# Patient Record
Sex: Female | Born: 1990
Health system: Southern US, Community
[De-identification: ages and names within clinical notes are randomized; demographics above are authoritative.]

## PROBLEM LIST (undated history)

## (undated) DIAGNOSIS — B001 Herpesviral vesicular dermatitis: Secondary | ICD-10-CM

## (undated) HISTORY — DX: Herpesviral vesicular dermatitis: B00.1

## (undated) HISTORY — PX: OTHER SURGICAL HISTORY: SHX169

---
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
## 2016-04-25 ENCOUNTER — Emergency Department (HOSPITAL_COMMUNITY)
Admission: EM | Admit: 2016-04-25 | Discharge: 2016-04-25 | Disposition: A | Discharge status: Home / Self Care | Payer: Self-pay | Attending: Emergency Medicine | Admitting: Emergency Medicine

## 2016-04-25 ENCOUNTER — Encounter (HOSPITAL_COMMUNITY): Payer: Self-pay | Admitting: Emergency Medicine

## 2016-04-25 DIAGNOSIS — R05 Cough: Secondary | ICD-10-CM | POA: Insufficient documentation

## 2016-04-25 DIAGNOSIS — J3489 Other specified disorders of nose and nasal sinuses: Secondary | ICD-10-CM | POA: Insufficient documentation

## 2016-04-25 DIAGNOSIS — B001 Herpesviral vesicular dermatitis: Secondary | ICD-10-CM | POA: Insufficient documentation

## 2016-04-25 MED ORDER — VALACYCLOVIR HCL 1 G PO TABS: ORAL_TABLET | ORAL | 0 refills | Status: AC

## 2016-04-25 MED ORDER — HYDROCODONE-ACETAMINOPHEN 5-325 MG PO TABS: 2.0000 | ORAL_TABLET | Freq: Once | ORAL | Status: DC

## 2016-04-25 NOTE — ED Triage Notes (Signed)
Pt reports waking up with cold sore to top mid lip.  No swelling noted, airway patent.

## 2016-04-25 NOTE — ED Provider Notes (Signed)
[facility]PT Provider Note   CSN: [id number] Arrival date & time: 04/25/16  1111  By signing my name below, I, Soijett Blue, attest that this documentation has been prepared under the direction and in the presence of Ivery QualeHobson Cherylin Waguespack, PA-C Electronically Signed: Soijett Blue, ED Scribe. 04/25/16. 11:52 AM.   History   Chief Complaint Chief Complaint  Patient presents with  . Mouth Lesions    HPI Melanie Noble is a 25 y.o. female who presents to the Emergency Department complaining of mouth lesion onset this morning. Pt notes that she believes she has a cold sore, but she wants to make sure that it is a cold sore and that is what brought her in to be evaluated. Pt reports that the area is pruritic and non-painful at this time. Pt is having associated symptoms of rhinorrhea and cough. She notes that she has tried OTC allergy medications for the relief of her symptoms. She denies fever, chills, rash, and any other symptoms.     The history is provided by the patient. No language interpreter was used.  Mouth Lesions  Location:  Upper lip Upper lip location: Mid. Quality:  Blistered Onset quality:  Sudden Severity:  Mild Duration:  1 hour Progression:  Unchanged Chronicity:  New Relieved by:  None tried Worsened by:  Nothing Ineffective treatments:  None tried Associated symptoms: rhinorrhea   Associated symptoms: no fever and no rash     History reviewed. No pertinent past medical history.  There are no active problems to display for this patient.   History reviewed. No pertinent surgical history.  OB History    No data available       Home Medications    Prior to Admission medications   Not on File    Family History History reviewed. No pertinent family history.  Social History Social History  Substance Use Topics  . Smoking status: Never Smoker  . Smokeless tobacco: Not on file  . Alcohol use No     Allergies   Review of patient's allergies  indicates no known allergies.   Review of Systems Review of Systems  Constitutional: Negative for chills and fever.  HENT: Positive for mouth sores (mid upper lip) and rhinorrhea.   Respiratory: Positive for cough.   Skin: Negative for rash.  All other systems reviewed and are negative.    Physical Exam Updated Vital Signs BP 117/68 (BP Location: Right Arm)   Pulse 75   Temp 98.3 F (36.8 C) (Oral)   Resp 18   Ht 5\' 5"  (1.651 m)   Wt 230 lb (104.3 kg)   LMP 03/29/2016 (Exact Date)   SpO2 99%   BMI 38.27 kg/m   Physical Exam  Constitutional: She is oriented to person, place, and time. She appears well-developed and well-nourished. No distress.  HENT:  Head: Normocephalic and atraumatic.  Mouth/Throat: Uvula is midline, oropharynx is clear and moist and mucous membranes are normal. Oral lesions present.  Small broken skins areas of mucosa of lower lip. No lesions of lower gum. Blister at the mid upper lip. No lesions of the mucosa of the upper lip. Oropharynx clear.  Eyes: EOM are normal.  Neck: Normal range of motion. Neck supple.  No cervical LAD. Neck is supple with FROM.  Cardiovascular: Normal rate, regular rhythm and normal heart sounds.  Exam reveals no gallop and no friction rub.   No murmur heard. Pulmonary/Chest: Effort normal and breath sounds normal. No respiratory distress. She has no wheezes.  She has no rales.  Abdominal: She exhibits no distension.  Musculoskeletal: Normal range of motion.  Lymphadenopathy:    She has no cervical adenopathy.  Neurological: She is alert and oriented to person, place, and time.  Skin: Skin is warm and dry. No rash noted.  No rash to palms.  Psychiatric: She has a normal mood and affect. Her behavior is normal.  Nursing note and vitals reviewed.    ED Treatments / Results  DIAGNOSTIC STUDIES: Oxygen Saturation is 99% on RA, nl by my interpretation.    COORDINATION OF CARE: 11:49 AM Discussed treatment plan with pt at  bedside which includes Rx valtrex and pt agreed to plan.   Procedures Procedures (including critical care time)  Medications Ordered in ED Medications - No data to display   Initial Impression / Assessment and Plan / ED Course  I have reviewed the triage vital signs and the nursing notes.  Clinical Course    **I have reviewed nursing notes, vital signs, and all appropriate lab and imaging results for this patient.*  Final Clinical Impressions(s) / ED Diagnoses  The vital signs within normal limits. The examination favors herpes labialis (cold sore). I discussed with the patient the use of a brief or Campho-Phenique. The patient states that she would rather have Valtrex. Prescription for Valtrex given to the patient. We also discussed the importance of handwashing, and on not sharing eating utensils or kissing until symptoms had completely resolved.    Final diagnoses:  None    New Prescriptions New Prescriptions   No medications on file   **I personally performed the services described in this documentation, which was scribed in my presence. The recorded information has been reviewed and is accurate.Ivery Quale, PA-C 04/25/16 1203    Nelva Nay, MD 04/30/16 [phone]

## 2016-04-25 NOTE — Discharge Instructions (Signed)
You have a cold sore present. Please do not share eating utensils, or kiss anyone until this has completely resolved. Use 2 Valtrex 2 times daily, 12 hours apart. Wash hands frequently.

## 2019-06-25 ENCOUNTER — Ambulatory Visit: Payer: Medicaid Other | Attending: Internal Medicine

## 2019-06-25 ENCOUNTER — Other Ambulatory Visit: Payer: Self-pay

## 2019-06-25 DIAGNOSIS — Z20822 Contact with and (suspected) exposure to covid-19: Secondary | ICD-10-CM

## 2019-06-26 LAB — NOVEL CORONAVIRUS, NAA: SARS-CoV-2, NAA: NOT DETECTED

## 2019-07-25 DIAGNOSIS — Z20828 Contact with and (suspected) exposure to other viral communicable diseases: Secondary | ICD-10-CM | POA: Diagnosis not present

## 2019-08-16 DIAGNOSIS — Z20828 Contact with and (suspected) exposure to other viral communicable diseases: Secondary | ICD-10-CM | POA: Diagnosis not present

## 2019-09-23 DIAGNOSIS — R197 Diarrhea, unspecified: Secondary | ICD-10-CM | POA: Diagnosis not present

## 2019-09-23 DIAGNOSIS — Z6839 Body mass index (BMI) 39.0-39.9, adult: Secondary | ICD-10-CM | POA: Diagnosis not present

## 2019-09-23 DIAGNOSIS — R109 Unspecified abdominal pain: Secondary | ICD-10-CM | POA: Diagnosis not present

## 2019-10-10 DIAGNOSIS — Z8619 Personal history of other infectious and parasitic diseases: Secondary | ICD-10-CM | POA: Diagnosis not present

## 2019-10-10 DIAGNOSIS — R197 Diarrhea, unspecified: Secondary | ICD-10-CM | POA: Diagnosis not present

## 2019-10-10 DIAGNOSIS — Z6839 Body mass index (BMI) 39.0-39.9, adult: Secondary | ICD-10-CM | POA: Diagnosis not present

## 2019-10-10 DIAGNOSIS — R109 Unspecified abdominal pain: Secondary | ICD-10-CM | POA: Diagnosis not present

## 2020-02-14 DIAGNOSIS — Z20822 Contact with and (suspected) exposure to covid-19: Secondary | ICD-10-CM | POA: Diagnosis not present

## 2020-04-01 DIAGNOSIS — Z975 Presence of (intrauterine) contraceptive device: Secondary | ICD-10-CM | POA: Diagnosis not present

## 2020-04-01 DIAGNOSIS — Z01419 Encounter for gynecological examination (general) (routine) without abnormal findings: Secondary | ICD-10-CM | POA: Diagnosis not present

## 2020-04-01 DIAGNOSIS — Z6839 Body mass index (BMI) 39.0-39.9, adult: Secondary | ICD-10-CM | POA: Diagnosis not present

## 2020-04-01 DIAGNOSIS — N941 Unspecified dyspareunia: Secondary | ICD-10-CM | POA: Diagnosis not present

## 2020-04-13 DIAGNOSIS — N941 Unspecified dyspareunia: Secondary | ICD-10-CM | POA: Diagnosis not present

## 2020-04-13 DIAGNOSIS — Z975 Presence of (intrauterine) contraceptive device: Secondary | ICD-10-CM | POA: Diagnosis not present

## 2020-04-13 DIAGNOSIS — N83291 Other ovarian cyst, right side: Secondary | ICD-10-CM | POA: Diagnosis not present

## 2020-04-13 DIAGNOSIS — R9389 Abnormal findings on diagnostic imaging of other specified body structures: Secondary | ICD-10-CM | POA: Diagnosis not present

## 2020-04-13 DIAGNOSIS — N83201 Unspecified ovarian cyst, right side: Secondary | ICD-10-CM | POA: Diagnosis not present

## 2020-04-17 DIAGNOSIS — Z975 Presence of (intrauterine) contraceptive device: Secondary | ICD-10-CM | POA: Diagnosis not present

## 2020-04-17 DIAGNOSIS — R197 Diarrhea, unspecified: Secondary | ICD-10-CM | POA: Diagnosis not present

## 2020-04-17 DIAGNOSIS — N941 Unspecified dyspareunia: Secondary | ICD-10-CM | POA: Diagnosis not present

## 2020-04-17 DIAGNOSIS — M6289 Other specified disorders of muscle: Secondary | ICD-10-CM | POA: Diagnosis not present

## 2020-04-28 DIAGNOSIS — N941 Unspecified dyspareunia: Secondary | ICD-10-CM | POA: Diagnosis not present

## 2020-05-07 ENCOUNTER — Encounter: Payer: Self-pay | Admitting: Internal Medicine

## 2020-06-02 DIAGNOSIS — H6690 Otitis media, unspecified, unspecified ear: Secondary | ICD-10-CM | POA: Diagnosis not present

## 2020-06-08 ENCOUNTER — Other Ambulatory Visit: Payer: Self-pay

## 2020-06-08 ENCOUNTER — Ambulatory Visit (INDEPENDENT_AMBULATORY_CARE_PROVIDER_SITE_OTHER): Payer: BC Managed Care – PPO | Admitting: Internal Medicine

## 2020-06-08 ENCOUNTER — Encounter: Payer: Self-pay | Admitting: Internal Medicine

## 2020-06-08 DIAGNOSIS — R197 Diarrhea, unspecified: Secondary | ICD-10-CM | POA: Insufficient documentation

## 2020-06-08 DIAGNOSIS — R12 Heartburn: Secondary | ICD-10-CM

## 2020-06-08 DIAGNOSIS — K219 Gastro-esophageal reflux disease without esophagitis: Secondary | ICD-10-CM | POA: Diagnosis not present

## 2020-06-08 DIAGNOSIS — R109 Unspecified abdominal pain: Secondary | ICD-10-CM | POA: Diagnosis not present

## 2020-06-08 MED ORDER — OMEPRAZOLE 20 MG PO CPDR: 20.0000 mg | DELAYED_RELEASE_CAPSULE | Freq: Two times a day (BID) | ORAL | 5 refills | Status: DC

## 2020-06-08 NOTE — Progress Notes (Signed)
Primary Care Physician:  Lawerance Sabal, [place] Primary Gastroenterologist:  Dr. Marletta Lor  Chief Complaint  Patient presents with  . Diarrhea    2-3 times per day  . Abdominal Pain    comes/goes    HPI:   Melanie Noble is a 29 y.o. female who presents to the clinic today by referral from her Quentin Mulling for evaluation.  Patient has multiple GI complaints for me today.  She states she has daily heartburn and acid reflux.  Notes a sour taste in her mouth.  Also regurgitates food on occasion.  No overt dysphagia odynophagia.  No melena hematochezia.  No chronic NSAID use.  No history of H. pylori or peptic ulcer disease.  Also notes diarrhea daily.  States sometimes it is watery and other times just very loose.  Has noted that it's been dark as of late but she has been drinking Pepto-Bismol.  No unintentional weight loss.  No family history of inflammatory bowel disease.  Does note her grandfather died of colon cancer around the age of 51.  Also notes that both her mother and sister have thyroid disease.  Otherwise she has no other complaints.  Past Medical History:  Diagnosis Date  . Cold sore     Past Surgical History:  Procedure Laterality Date  . none      Current Outpatient Medications  Medication Sig Dispense Refill  . valACYclovir (VALTREX) 1000 MG tablet 2 tabs bid, take with food (Patient taking differently: daily. ) 4 tablet 0   No current facility-administered medications for this visit.    Allergies as of 06/08/2020  . (No Known Allergies)    Family History  Problem Relation Age of Onset  . Thyroid disease Mother   . Heart disease Father   . Colon cancer Maternal Grandfather     Social History   Socioeconomic History  . Marital status: Single    Spouse name: Not on file  . Number of children: Not on file  . Years of education: Not on file  . Highest education level: Not on file  Occupational History  . Not on file  Tobacco Use  . Smoking status:  Never Smoker  . Smokeless tobacco: Never Used  Substance and Sexual Activity  . Alcohol use: Yes    Comment: occas  . Drug use: No  . Sexual activity: Not on file  Other Topics Concern  . Not on file  Social History Narrative  . Not on file   Social Determinants of Health   Financial Resource Strain:   . Difficulty of Paying Living Expenses: Not on file  Food Insecurity:   . Worried About Programme researcher, broadcasting/film/video in the Last Year: Not on file  . Ran Out of Food in the Last Year: Not on file  Transportation Needs:   . Lack of Transportation (Medical): Not on file  . Lack of Transportation (Non-Medical): Not on file  Physical Activity:   . Days of Exercise per Week: Not on file  . Minutes of Exercise per Session: Not on file  Stress:   . Feeling of Stress : Not on file  Social Connections:   . Frequency of Communication with Friends and Family: Not on file  . Frequency of Social Gatherings with Friends and Family: Not on file  . Attends Religious Services: Not on file  . Active Member of Clubs or Organizations: Not on file  . Attends Banker Meetings: Not on file  .  Marital Status: Not on file  Intimate Partner Violence:   . Fear of Current or Ex-Partner: Not on file  . Emotionally Abused: Not on file  . Physically Abused: Not on file  . Sexually Abused: Not on file    Subjective: Review of Systems  Constitutional: Negative for chills and fever.  HENT: Negative for congestion and hearing loss.   Eyes: Negative for blurred vision and double vision.  Respiratory: Negative for cough and shortness of breath.   Cardiovascular: Negative for chest pain and palpitations.  Gastrointestinal: Positive for diarrhea and heartburn. Negative for abdominal pain, blood in stool, constipation, melena and vomiting.  Genitourinary: Negative for dysuria and urgency.  Musculoskeletal: Negative for joint pain and myalgias.  Skin: Negative for itching and rash.  Neurological:  Negative for dizziness and headaches.  Psychiatric/Behavioral: Negative for depression. The patient is not nervous/anxious.        Objective: BP 124/84   Pulse 64   Temp 97.7 F (36.5 C)   Ht 5\' 3"  (1.6 m)   Wt 233 lb 12.8 oz (106.1 kg)   BMI 41.42 kg/m  Physical Exam Constitutional:      Appearance: Normal appearance. She is obese.  HENT:     Head: Normocephalic and atraumatic.  Eyes:     Extraocular Movements: Extraocular movements intact.     Conjunctiva/sclera: Conjunctivae normal.  Cardiovascular:     Rate and Rhythm: Normal rate and regular rhythm.  Pulmonary:     Effort: Pulmonary effort is normal.     Breath sounds: Normal breath sounds.  Abdominal:     General: Bowel sounds are normal.     Palpations: Abdomen is soft.  Musculoskeletal:        General: No swelling. Normal range of motion.     Cervical back: Normal range of motion and neck supple.  Skin:    General: Skin is warm and dry.     Coloration: Skin is not jaundiced.  Neurological:     General: No focal deficit present.     Mental Status: She is alert and oriented to person, place, and time.  Psychiatric:        Mood and Affect: Mood normal.        Behavior: Behavior normal.      Assessment: *Heartburn *Acid reflux *Abdominal cramping *Diarrhea-chronic  Plan: Etiology of patient's symptoms unclear.  Irritable bowel syndrome is certainly high on differential though we'll have to rule out other etiologies.    Will schedule for EGD to evaluate for peptic ulcer disease, esophagitis, gastritis, H. Pylori, duodenitis, or other. Will also evaluate for esophageal stricture, Schatzki's ring, esophageal web or other.   At the same time we will perform colonoscopy with random colon biopsies to rule out underlying inflammatory bowel disease such as Crohn's disease or ulcerative colitis, microscopic colitis, polyps, occult malignancy, or other.  The risks including infection, bleed, or perforation as  well as benefits, limitations, alternatives and imponderables have been reviewed with the patient. Potential for esophageal dilation, biopsy, etc. have also been reviewed.  Questions have been answered. All parties agreeable.  If EGD and colonoscopy unremarkable, we will consider right upper quadrant ultrasound to rule out biliary colic.  Thank you for the kind referral.  06/08/2020 3:18 PM   Disclaimer: This note was dictated with voice recognition software. Similar sounding words can inadvertently be transcribed and may not be corrected upon review.

## 2020-06-08 NOTE — Patient Instructions (Signed)
We will schedule you for EGD to further evaluate your worsening heartburn and acid reflux.  At the same time we'll perform colonoscopy to evaluate your diarrhea and abdominal cramping.  Going to start you on a new medication called omeprazole 20 mg twice daily.  I want you to take this 30 minutes before breakfast and 30 minutes before dinner.  If endoscopy/colonoscopy unremarkable, we'll consider right upper quadrant ultrasound to evaluate your gallbladder.  Further recommendations to follow  At [facility] we value your feedback. You may receive a survey about your visit today. Please share your experience as we strive to create trusting relationships with our patients to provide genuine, compassionate, quality care.  We appreciate your understanding and patience as we review any laboratory studies, imaging, and other diagnostic tests that are ordered as we care for you. Our office policy is 5 business days for review of these results, and any emergent or urgent results are addressed in a timely manner for your best interest. If you do not hear from our office in 1 week, please contact us.   We also encourage the use of MyChart, which contains your medical information for your review as well. If you are not enrolled in this feature, an access code is on this after visit summary for your convenience. Thank you for allowing [place] to be involved in your care.  It was great to see you today!  I hope you have a great rest of your winter!!    Hennie Duos. Marletta Lor, D.O. Gastroenterology and Hepatology [facility]

## 2020-06-10 LAB — TISSUE TRANSGLUTAMINASE, IGA

## 2020-06-10 LAB — COMPREHENSIVE METABOLIC PANEL

## 2020-06-10 LAB — TSH+FREE T4

## 2020-06-10 LAB — IGA

## 2020-06-11 ENCOUNTER — Ambulatory Visit: Payer: Self-pay | Admitting: Internal Medicine

## 2020-06-15 ENCOUNTER — Other Ambulatory Visit: Payer: Self-pay | Admitting: Internal Medicine

## 2020-06-15 DIAGNOSIS — R197 Diarrhea, unspecified: Secondary | ICD-10-CM | POA: Diagnosis not present

## 2020-06-15 DIAGNOSIS — Z79899 Other long term (current) drug therapy: Secondary | ICD-10-CM | POA: Diagnosis not present

## 2020-06-15 DIAGNOSIS — R109 Unspecified abdominal pain: Secondary | ICD-10-CM | POA: Diagnosis not present

## 2020-06-16 LAB — COMPREHENSIVE METABOLIC PANEL
AG Ratio: 1.4 (calc) (ref 1.0–2.5)
ALT: 14 U/L (ref 6–29)
AST: 14 U/L (ref 10–30)
Albumin: 4 g/dL (ref 3.6–5.1)
Alkaline phosphatase (APISO): 78 U/L (ref 31–125)
BUN: 11 mg/dL (ref 7–25)
CO2: 27 mmol/L (ref 20–32)
Calcium: 9.4 mg/dL (ref 8.6–10.2)
Chloride: 105 mmol/L (ref 98–110)
Creat: 0.87 mg/dL (ref 0.50–1.10)
Globulin: 2.9 g/dL (calc) (ref 1.9–3.7)
Glucose, Bld: 98 mg/dL (ref 65–139)
Potassium: 4.3 mmol/L (ref 3.5–5.3)
Sodium: 139 mmol/L (ref 135–146)
Total Bilirubin: 0.4 mg/dL (ref 0.2–1.2)
Total Protein: 6.9 g/dL (ref 6.1–8.1)

## 2020-06-16 LAB — IGA: Immunoglobulin A: 125 mg/dL (ref 47–310)

## 2020-06-16 LAB — TISSUE TRANSGLUTAMINASE, IGA: (tTG) Ab, IgA: <1 U/mL

## 2020-06-16 LAB — TSH+FREE T4: TSH W/REFLEX TO FT4: 1.39 mIU/L

## 2020-06-19 NOTE — Progress Notes (Signed)
Called pt and informed her that her blood tests were negative for celiac disease, thyroid studies normal. Informed pt that we should proceed with EGD/CLN which is already be scheduled.  Pt voiced understanding.

## 2020-07-23 ENCOUNTER — Other Ambulatory Visit (HOSPITAL_COMMUNITY): Payer: BC Managed Care – PPO | Attending: Internal Medicine

## 2020-07-23 ENCOUNTER — Encounter (HOSPITAL_COMMUNITY)
Admission: RE | Admit: 2020-07-23 | Discharge: 2020-07-23 | Disposition: A | Discharge status: Home / Self Care | Payer: BC Managed Care – PPO | Source: Ambulatory Visit | Attending: Internal Medicine | Admitting: Internal Medicine

## 2020-07-23 ENCOUNTER — Other Ambulatory Visit: Payer: Self-pay

## 2020-07-23 DIAGNOSIS — Z01812 Encounter for preprocedural laboratory examination: Secondary | ICD-10-CM | POA: Insufficient documentation

## 2020-07-23 LAB — PREGNANCY, URINE: Preg Test, Ur: NEGATIVE

## 2020-07-23 NOTE — Patient Instructions (Signed)
Your procedure is scheduled on: 07/28/2020  Report to [facility] at   10:00  AM.  Call this number if you have problems the morning of surgery: [phone]   Remember:              Follow Directions on the letter you received from Your Physician's office regarding the Bowel Prep              No Smoking the day of Procedure :   Take these medicines the morning of surgery with A SIP OF WATER: Omerprazole   Do not wear jewelry, make-up or nail polish.    Do not bring valuables to the hospital.  Contacts, dentures or bridgework may not be worn into surgery.  .   Patients discharged the day of surgery will not be allowed to drive home.     Colonoscopy, Adult, Care After This sheet gives you information about how to care for yourself after your procedure. Your health care provider may also give you more specific instructions. If you have problems or questions, contact your health care provider. What can I expect after the procedure? After the procedure, it is common to have:  A small amount of blood in your stool for 24 hours after the procedure.  Some gas.  Mild abdominal cramping or bloating.  Follow these instructions at home: General instructions   For the first 24 hours after the procedure: ? Do not drive or use machinery. ? Do not sign important documents. ? Do not drink alcohol. ? Do your regular daily activities at a slower pace than normal. ? Eat soft, easy-to-digest foods. ? Rest often.  Take over-the-counter or prescription medicines only as told by your health care provider.  It is up to you to get the results of your procedure. Ask your health care provider, or the department performing the procedure, when your results will be ready. Relieving cramping and bloating  Try walking around when you have cramps or feel bloated.  Apply heat to your abdomen as told by your health care provider. Use a heat source that your health care provider recommends, such as a  moist heat pack or a heating pad. ? Place a towel between your skin and the heat source. ? Leave the heat on for 20-30 minutes. ? Remove the heat if your skin turns bright red. This is especially important if you are unable to feel pain, heat, or cold. You may have a greater risk of getting burned. Eating and drinking  Drink enough fluid to keep your urine clear or pale yellow.  Resume your normal diet as instructed by your health care provider. Avoid heavy or fried foods that are hard to digest.  Avoid drinking alcohol for as long as instructed by your health care provider. Contact a health care provider if:  You have blood in your stool 2-3 days after the procedure. Get help right away if:  You have more than a small spotting of blood in your stool.  You pass large blood clots in your stool.  Your abdomen is swollen.  You have nausea or vomiting.  You have a fever.  You have increasing abdominal pain that is not relieved with medicine. This information is not intended to replace advice given to you by your health care provider. Make sure you discuss any questions you have with your health care provider. Document Released: 02/02/2004 Document Revised: 03/14/2016 Document Reviewed: 09/01/2015 Elsevier Interactive Patient Education  [employer].  Upper Endoscopy, Adult, Care After This sheet gives you information about how to care for yourself after your procedure. Your health care provider may also give you more specific instructions. If you have problems or questions, contact your health care provider. What can I expect after the procedure? After the procedure, it is common to have:  A sore throat.  Mild stomach pain or discomfort.  Bloating.  Nausea. Follow these instructions at home:  Follow instructions from your health care provider about what to eat or drink after your procedure.  Return to your normal activities as told by your health care provider. Ask  your health care provider what activities are safe for you.  Take over-the-counter and prescription medicines only as told by your health care provider.  If you were given a sedative during the procedure, it can affect you for several hours. Do not drive or operate machinery until your health care provider says that it is safe.  Keep all follow-up visits as told by your health care provider. This is important.   Contact a health care provider if you have:  A sore throat that lasts longer than one day.  Trouble swallowing. Get help right away if:  You vomit blood or your vomit looks like coffee grounds.  You have: ? A fever. ? Bloody, black, or tarry stools. ? A severe sore throat or you cannot swallow. ? Difficulty breathing. ? Severe pain in your chest or abdomen. Summary  After the procedure, it is common to have a sore throat, mild stomach discomfort, bloating, and nausea.  If you were given a sedative during the procedure, it can affect you for several hours. Do not drive or operate machinery until your health care provider says that it is safe.  Follow instructions from your health care provider about what to eat or drink after your procedure.  Return to your normal activities as told by your health care provider. This information is not intended to replace advice given to you by your health care provider. Make sure you discuss any questions you have with your health care provider. Document Revised: 06/18/2019 Document Reviewed: 11/20/2017 Elsevier Patient Education  2021 [employer].

## 2020-07-24 ENCOUNTER — Other Ambulatory Visit (HOSPITAL_COMMUNITY): Payer: BC Managed Care – PPO

## 2020-07-24 ENCOUNTER — Encounter (HOSPITAL_COMMUNITY): Admission: RE | Admit: 2020-07-24 | Payer: BC Managed Care – PPO | Source: Ambulatory Visit

## 2020-07-27 NOTE — Progress Notes (Signed)
Talked with pt. Informed to be at hospital at 0900 for rapid covid test. Voiced understanding.

## 2020-07-28 ENCOUNTER — Ambulatory Visit (HOSPITAL_COMMUNITY): Payer: BC Managed Care – PPO | Admitting: Anesthesiology

## 2020-07-28 ENCOUNTER — Encounter (HOSPITAL_COMMUNITY): Payer: Self-pay

## 2020-07-28 ENCOUNTER — Ambulatory Visit (HOSPITAL_COMMUNITY)
Admission: RE | Admit: 2020-07-28 | Discharge: 2020-07-28 | Disposition: A | Discharge status: Home / Self Care | Payer: BC Managed Care – PPO | Attending: Internal Medicine | Admitting: Internal Medicine

## 2020-07-28 ENCOUNTER — Other Ambulatory Visit: Payer: Self-pay

## 2020-07-28 ENCOUNTER — Encounter (HOSPITAL_COMMUNITY)
Admission: RE | Disposition: A | Discharge status: Home / Self Care | Payer: Self-pay | Source: Home / Self Care | Attending: Internal Medicine

## 2020-07-28 DIAGNOSIS — R12 Heartburn: Secondary | ICD-10-CM | POA: Insufficient documentation

## 2020-07-28 DIAGNOSIS — Z79899 Other long term (current) drug therapy: Secondary | ICD-10-CM | POA: Insufficient documentation

## 2020-07-28 DIAGNOSIS — Z8349 Family history of other endocrine, nutritional and metabolic diseases: Secondary | ICD-10-CM | POA: Diagnosis not present

## 2020-07-28 DIAGNOSIS — Z793 Long term (current) use of hormonal contraceptives: Secondary | ICD-10-CM | POA: Diagnosis not present

## 2020-07-28 DIAGNOSIS — K2970 Gastritis, unspecified, without bleeding: Secondary | ICD-10-CM

## 2020-07-28 DIAGNOSIS — Z800 Family history of malignant neoplasm of digestive organs: Secondary | ICD-10-CM | POA: Insufficient documentation

## 2020-07-28 DIAGNOSIS — K648 Other hemorrhoids: Secondary | ICD-10-CM | POA: Diagnosis not present

## 2020-07-28 DIAGNOSIS — R1013 Epigastric pain: Secondary | ICD-10-CM | POA: Insufficient documentation

## 2020-07-28 DIAGNOSIS — R109 Unspecified abdominal pain: Secondary | ICD-10-CM | POA: Diagnosis not present

## 2020-07-28 DIAGNOSIS — R197 Diarrhea, unspecified: Secondary | ICD-10-CM | POA: Diagnosis not present

## 2020-07-28 DIAGNOSIS — Z20822 Contact with and (suspected) exposure to covid-19: Secondary | ICD-10-CM | POA: Insufficient documentation

## 2020-07-28 DIAGNOSIS — K319 Disease of stomach and duodenum, unspecified: Secondary | ICD-10-CM | POA: Insufficient documentation

## 2020-07-28 DIAGNOSIS — K589 Irritable bowel syndrome without diarrhea: Secondary | ICD-10-CM | POA: Diagnosis not present

## 2020-07-28 DIAGNOSIS — K3189 Other diseases of stomach and duodenum: Secondary | ICD-10-CM | POA: Diagnosis not present

## 2020-07-28 DIAGNOSIS — K219 Gastro-esophageal reflux disease without esophagitis: Secondary | ICD-10-CM | POA: Diagnosis not present

## 2020-07-28 DIAGNOSIS — K2289 Other specified disease of esophagus: Secondary | ICD-10-CM | POA: Diagnosis not present

## 2020-07-28 DIAGNOSIS — K529 Noninfective gastroenteritis and colitis, unspecified: Secondary | ICD-10-CM | POA: Diagnosis not present

## 2020-07-28 HISTORY — PX: ESOPHAGOGASTRODUODENOSCOPY (EGD) WITH PROPOFOL: SHX5813

## 2020-07-28 HISTORY — PX: COLONOSCOPY WITH PROPOFOL: SHX5780

## 2020-07-28 HISTORY — PX: BIOPSY: SHX5522

## 2020-07-28 LAB — SARS CORONAVIRUS 2 BY RT PCR (HOSPITAL ORDER, PERFORMED IN ~~LOC~~ HOSPITAL LAB): SARS Coronavirus 2: NEGATIVE

## 2020-07-28 SURGERY — COLONOSCOPY WITH PROPOFOL
Anesthesia: General

## 2020-07-28 MED ORDER — OMEPRAZOLE 40 MG PO CPDR: 40.0000 mg | DELAYED_RELEASE_CAPSULE | Freq: Two times a day (BID) | ORAL | 5 refills | Status: DC

## 2020-07-28 MED ORDER — LACTATED RINGERS IV SOLN: INTRAVENOUS | Status: DC

## 2020-07-28 MED ORDER — PROPOFOL 10 MG/ML IV BOLUS
INTRAVENOUS | Status: DC | PRN
  Administered 2020-07-28: 150 ug/kg/min via INTRAVENOUS
  Administered 2020-07-28 (×2): 100 mg via INTRAVENOUS

## 2020-07-28 MED ORDER — LIDOCAINE HCL (CARDIAC) PF 50 MG/5ML IV SOSY
PREFILLED_SYRINGE | INTRAVENOUS | Status: DC | PRN
  Administered 2020-07-28: 40 mg via INTRAVENOUS

## 2020-07-28 MED ORDER — STERILE WATER FOR IRRIGATION IR SOLN
Status: DC | PRN
  Administered 2020-07-28: 200 mL

## 2020-07-28 NOTE — H&P (Signed)
Primary Care Physician:  Lawerance Sabal, [place] Primary Gastroenterologist:  Dr. Marletta Lor  Pre-Procedure History & Physical: HPI:  Melanie Noble is a 30 y.o. female is here for an EGD and colonoscopy. Patient has multiple GI complaints.  She states she has daily heartburn and acid reflux.  Notes a sour taste in her mouth.  Also regurgitates food on occasion.  No overt dysphagia odynophagia.  No melena hematochezia.  No chronic NSAID use.  No history of H. pylori or peptic ulcer disease.    Also notes diarrhea daily.  States sometimes it is watery and other times just very loose.  Has noted that it's been dark as of late but she has been drinking Pepto-Bismol.  No unintentional weight loss.  No family history of inflammatory bowel disease.  Does note her grandfather died of colon cancer around the age of 44.  Also notes that both her mother and sister have thyroid disease.    Past Medical History:  Diagnosis Date  . Cold sore     Past Surgical History:  Procedure Laterality Date  . none      Prior to Admission medications   Medication Sig Start Date End Date Taking? Authorizing Provider  acetaminophen (TYLENOL) 500 MG tablet Take 1,000 mg by mouth every 6 (six) hours as needed for moderate pain or mild pain.   Yes [provider]  acyclovir (ZOVIRAX) 200 MG capsule Take 400 mg by mouth 2 (two) times daily. 03/25/20  Yes [provider]  Ibuprofen 200 MG CAPS Take 200 mg by mouth daily as needed (Headache).   Yes [provider]  levonorgestrel (LILETTA) 19.5 MCG/DAY IUD IUD 1 Intra Uterine Device by Intrauterine route once.   Yes [provider]  loratadine (CLARITIN) 10 MG tablet Take 10 mg by mouth daily.   Yes [provider]  omeprazole (PRILOSEC) 20 MG capsule Take 1 capsule (20 mg total) by mouth 2 (two) times daily before a meal. Patient taking differently: Take 20 mg by mouth daily as needed (Heartburn). 06/08/20 12/05/20 Yes Ellawyn Wogan,  Hennie Duos, DO  valACYclovir (VALTREX) 1000 MG tablet 2 tabs bid, take with food Patient not taking: No sig reported 04/25/16   Ivery Quale, PA-C    Allergies as of 06/08/2020  . (No Known Allergies)    Family History  Problem Relation Age of Onset  . Thyroid disease Mother   . Heart disease Father   . Colon cancer Maternal Grandfather     Social History   Socioeconomic History  . Marital status: Single    Spouse name: Not on file  . Number of children: Not on file  . Years of education: Not on file  . Highest education level: Not on file  Occupational History  . Not on file  Tobacco Use  . Smoking status: Never Smoker  . Smokeless tobacco: Never Used  Vaping Use  . Vaping Use: Never used  Substance and Sexual Activity  . Alcohol use: Yes    Comment: occas  . Drug use: No  . Sexual activity: Not on file  Other Topics Concern  . Not on file  Social History Narrative  . Not on file   Social Determinants of Health   Financial Resource Strain: Not on file  Food Insecurity: Not on file  Transportation Needs: Not on file  Physical Activity: Not on file  Stress: Not on file  Social Connections: Not on file  Intimate Partner Violence: Not on file  Review of Systems: See HPI, otherwise negative ROS  Impression/Plan: Melanie Noble is here for and EGD heartburn, acid reflux and a colonoscopy to be performed for abdominal cramping and diarrhea.   The risks of the procedure including infection, bleed, or perforation as well as benefits, limitations, alternatives and imponderables have been reviewed with the patient. Questions have been answered. All parties agreeable.

## 2020-07-28 NOTE — Discharge Instructions (Addendum)
EGD Discharge instructions Please read the instructions outlined below and refer to this sheet in the next few weeks. These discharge instructions provide you with general information on caring for yourself after you leave the hospital. Your doctor may also give you specific instructions. While your treatment has been planned according to the most current medical practices available, unavoidable complications occasionally occur. If you have any problems or questions after discharge, please call your doctor. ACTIVITY  You may resume your regular activity but move at a slower pace for the next 24 hours.   Take frequent rest periods for the next 24 hours.   Walking will help expel (get rid of) the air and reduce the bloated feeling in your abdomen.   No driving for 24 hours (because of the anesthesia (medicine) used during the test).   You may shower.   Do not sign any important legal documents or operate any machinery for 24 hours (because of the anesthesia used during the test).  NUTRITION  Drink plenty of fluids.   You may resume your normal diet.   Begin with a light meal and progress to your normal diet.   Avoid alcoholic beverages for 24 hours or as instructed by your caregiver.  MEDICATIONS  You may resume your normal medications unless your caregiver tells you otherwise.  WHAT YOU CAN EXPECT TODAY  You may experience abdominal discomfort such as a feeling of fullness or "gas" pains.  FOLLOW-UP  Your doctor will discuss the results of your test with you.  SEEK IMMEDIATE MEDICAL ATTENTION IF ANY OF THE FOLLOWING OCCUR:  Excessive nausea (feeling sick to your stomach) and/or vomiting.   Severe abdominal pain and distention (swelling).   Trouble swallowing.   Temperature over 101 F (37.8 C).   Rectal bleeding or vomiting of blood.    Your EGD showed findings consistent with a condition called eosinophilic esophagitis.  This is an allergy mediated process which can  cause heartburn, nausea, epigastric pain.  I took biopsies and we should have these results back by the end of the week.  I am going to increase your omeprazole to 40 mg twice daily for the next 8 weeks.  Follow-up with me in 2 months or sooner if needed. Office will call you to schedule follow up appointment.   Colonoscopy was unremarkable.  I did not find any evidence of underlying inflammatory bowel disease such as Crohn's disease or ulcerative colitis.  No polyps or evidence of colon cancer.  I did take biopsies to rule out a condition called microscopic colitis which can cause chronic diarrhea.  Repeat colonoscopy at age 30 for screening purposes.  I hope you have a great rest of your week!  Hennie Duos. Marletta Lor, D.O. Gastroenterology and Hepatology [facility]

## 2020-07-28 NOTE — Op Note (Signed)
[facility] Patient Name: Melanie Noble Procedure Date: 07/28/2020 12:11 PM MRN: [id number] Date of Birth: 21-Oct-1990 Attending MD: Elon Alas. Abbey Chatters DO CSN: [id number] Age: 30 Admit Type: Outpatient Procedure:                Colonoscopy Indications:              Chronic diarrhea Providers:                Elon Alas. Abbey Chatters, DO, Lambert Mody, Aram Candela Referring MD:              Medicines:                See the Anesthesia note for documentation of the                            administered medications Complications:            No immediate complications. Estimated Blood Loss:     Estimated blood loss was minimal. Procedure:                Pre-Anesthesia Assessment:                           - The anesthesia plan was to use monitored                            anesthesia care (MAC).                           After obtaining informed consent, the colonoscope                            was passed under direct vision. Throughout the                            procedure, the patient's blood pressure, pulse, and                            oxygen saturations were monitored continuously. The                            PCF-H190DL ([id number]) was introduced through the                            anus and advanced to the the cecum, identified by                            appendiceal orifice and ileocecal valve. The                            colonoscopy was performed without difficulty. The                            patient tolerated the procedure well. The quality  of the bowel preparation was evaluated using the                            BBPS [facility] Bowel Preparation Scale) with scores                            of: Right Colon = 2 (minor amount of residual                            staining, small fragments of stool and/or opaque                            liquid, but mucosa seen well), Transverse Colon = 3                             (entire mucosa seen well with no residual staining,                            small fragments of stool or opaque liquid) and Left                            Colon = 3 (entire mucosa seen well with no residual                            staining, small fragments of stool or opaque                            liquid). The total BBPS score equals 8. The quality                            of the bowel preparation was good. Scope In: 12:12:56 PM Scope Out: 12:22:13 PM Scope Withdrawal Time: 0 hours 6 minutes 29 seconds  Total Procedure Duration: 0 hours 9 minutes 17 seconds  Findings:      The perianal and digital rectal examinations were normal.      Non-bleeding internal hemorrhoids were found during endoscopy.      The terminal ileum appeared normal.      Biopsies for histology were taken with a cold forceps from the ascending       colon, transverse colon and descending colon for evaluation of       microscopic colitis. Impression:               - Non-bleeding internal hemorrhoids.                           - The examined portion of the ileum was normal.                           - Biopsies were taken with a cold forceps from the                            ascending colon, transverse colon and descending  colon for evaluation of microscopic colitis. Moderate Sedation:      Per Anesthesia Care Recommendation:           - Patient has a contact number available for                            emergencies. The signs and symptoms of potential                            delayed complications were discussed with the                            patient. Return to normal activities tomorrow.                            Written discharge instructions were provided to the                            patient.                           - Resume previous diet.                           - Continue present medications.                           - Await pathology  results.                           - Repeat colonoscopy at age 64 or sooner if higher                            risk for screening purposes.                           - Return to GI clinic in 2 months. Procedure Code(s):        --- Professional ---                           [id number], Colonoscopy, flexible; with biopsy, single                            or multiple Diagnosis Code(s):        --- Professional ---                           K64.8, Other hemorrhoids                           K52.9, Noninfective gastroenteritis and colitis,                            unspecified CPT copyright 2019 American Medical Association. All rights reserved. The codes documented in this report are preliminary and upon coder review may  be revised to meet current compliance requirements. Elon Alas. Abbey Chatters, DO [name], DO  07/28/2020 12:26:13 PM This report has been signed electronically. Number of Addenda: 0

## 2020-07-28 NOTE — Anesthesia Preprocedure Evaluation (Signed)
Anesthesia Evaluation  Patient identified by MRN, date of birth, ID band Patient awake    Reviewed: Allergy & Precautions, H&P , NPO status , Patient's Chart, lab work & pertinent test results, reviewed documented beta blocker date and time   Airway Mallampati: II  TM Distance: >3 FB Neck ROM: full    Dental no notable dental hx.    Pulmonary neg pulmonary ROS,    Pulmonary exam normal breath sounds clear to auscultation       Cardiovascular Exercise Tolerance: Good negative cardio ROS   Rhythm:regular Rate:Normal     Neuro/Psych negative neurological ROS  negative psych ROS   GI/Hepatic Neg liver ROS, GERD  Medicated,  Endo/Other  Morbid obesity  Renal/GU negative Renal ROS  negative genitourinary   Musculoskeletal   Abdominal   Peds  Hematology negative hematology ROS (+)   Anesthesia Other Findings   Reproductive/Obstetrics negative OB ROS                             Anesthesia Physical Anesthesia Plan  ASA: III  Anesthesia Plan: General   Post-op Pain Management:    Induction:   PONV Risk Score and Plan: Propofol infusion and TIVA  Airway Management Planned:   Additional Equipment:   Intra-op Plan:   Post-operative Plan:   Informed Consent: I have reviewed the patients History and Physical, chart, labs and discussed the procedure including the risks, benefits and alternatives for the proposed anesthesia with the patient or authorized representative who has indicated his/her understanding and acceptance.     Dental Advisory Given  Plan Discussed with: CRNA  Anesthesia Plan Comments:         Anesthesia Quick Evaluation

## 2020-07-28 NOTE — Anesthesia Postprocedure Evaluation (Signed)
Anesthesia Post Note  Patient: Melanie Noble  Procedure(s) Performed: COLONOSCOPY WITH PROPOFOL (N/A ) ESOPHAGOGASTRODUODENOSCOPY (EGD) WITH PROPOFOL (N/A ) BIOPSY  Patient location during evaluation: PACU Anesthesia Type: General Level of consciousness: awake, oriented, awake and alert and patient cooperative Pain management: pain level controlled Vital Signs Assessment: post-procedure vital signs reviewed and stable Respiratory status: spontaneous breathing, respiratory function stable and nonlabored ventilation Cardiovascular status: blood pressure returned to baseline and stable Postop Assessment: no headache and no backache Anesthetic complications: no   No complications documented.   Last Vitals:  Vitals:   07/28/20 1042  BP: (!) 154/51  Resp: 20  Temp: 36.9 C  SpO2: 100%    Last Pain:  Vitals:   07/28/20 1201  TempSrc:   PainSc: 0-No pain                 Brynda Peon

## 2020-07-28 NOTE — Op Note (Signed)
[facility] Patient Name: Melanie Noble Procedure Date: 07/28/2020 11:55 AM MRN: [id number] Date of Birth: Dec 03, 1990 Attending MD: Elon Alas. Edgar Frisk CSN: [id number] Age: 30 Admit Type: Outpatient Procedure:                Upper GI endoscopy Indications:              Epigastric abdominal pain, Heartburn Providers:                Elon Alas. Abbey Chatters, DO, Lambert Mody, Aram Candela Referring MD:              Medicines:                See the Anesthesia note for documentation of the                            administered medications Complications:            No immediate complications. Estimated Blood Loss:     Estimated blood loss was minimal. Procedure:                Pre-Anesthesia Assessment:                           - The anesthesia plan was to use monitored                            anesthesia care (MAC).                           After obtaining informed consent, the endoscope was                            passed under direct vision. Throughout the                            procedure, the patient's blood pressure, pulse, and                            oxygen saturations were monitored continuously. The                            GIF-H190 ([id number]) was introduced through the                            mouth, and advanced to the second part of duodenum.                            The upper GI endoscopy was accomplished without                            difficulty. The patient tolerated the procedure                            well. Scope In: 12:05:45 PM Scope Out:  12:09:22 PM Total Procedure Duration: 0 hours 3 minutes 37 seconds  Findings:      Mucosal changes including ringed esophagus, feline appearance and white       plaques were found in the middle third of the esophagus. Esophageal       findings were graded using the Eosinophilic Esophagitis Endoscopic       Reference Score (EoE-EREFS) as: Edema Grade 0 Normal (distinct  vascular       markings), Rings Grade 2 Moderate (distinct rings that do not occlude       passage of diagnostic 8-10 mm endoscope), Exudates Grade 1 Mild       (scattered white lesions involving less than 10 percent of the       esophageal surface area), Furrows Grade 0 None (no vertical lines seen)       and Stricture none (no stricture found). Biopsies were taken with a cold       forceps for histology.      Diffuse moderate inflammation characterized by erosions and erythema was       found in the entire examined stomach. Biopsies were taken with a cold       forceps for Helicobacter pylori testing.      The duodenal bulb, first portion of the duodenum and second portion of       the duodenum were normal. Biopsies for histology were taken with a cold       forceps for evaluation of celiac disease. Impression:               - Esophageal mucosal changes suspicious for                            eosinophilic esophagitis. Biopsied.                           - Gastritis. Biopsied.                           - Normal duodenal bulb, first portion of the                            duodenum and second portion of the duodenum.                            Biopsied. Moderate Sedation:      Per Anesthesia Care Recommendation:           - Patient has a contact number available for                            emergencies. The signs and symptoms of potential                            delayed complications were discussed with the                            patient. Return to normal activities tomorrow.                            Written discharge instructions were provided to the  patient.                           - Resume previous diet.                           - Continue present medications.                           - Await pathology results.                           - Repeat upper endoscopy after studies are complete                            for surveillance based  on pathology results.                           - Return to GI clinic in 2 months. Procedure Code(s):        --- Professional ---                           [phone], Esophagogastroduodenoscopy, flexible,                            transoral; with biopsy, single or multiple Diagnosis Code(s):        --- Professional ---                           K22.8, Other specified diseases of esophagus                           K29.70, Gastritis, unspecified, without bleeding                           R10.13, Epigastric pain                           R12, Heartburn CPT copyright 2019 American Medical Association. All rights reserved. The codes documented in this report are preliminary and upon coder review may  be revised to meet current compliance requirements. Elon Alas. Abbey Chatters, DO [place] Abbey Chatters, DO 07/28/2020 12:11:20 PM This report has been signed electronically. Number of Addenda: 0

## 2020-07-28 NOTE — Transfer of Care (Signed)
Immediate Anesthesia Transfer of Care Note  Patient: Melanie Noble  Procedure(s) Performed: COLONOSCOPY WITH PROPOFOL (N/A ) ESOPHAGOGASTRODUODENOSCOPY (EGD) WITH PROPOFOL (N/A ) BIOPSY  Patient Location: PACU  Anesthesia Type:General  Level of Consciousness: awake, alert , oriented and patient cooperative  Airway & Oxygen Therapy: Patient Spontanous Breathing  Post-op Assessment: Report given to RN, Post -op Vital signs reviewed and stable and Patient moving all extremities  Post vital signs: Reviewed and stable  Last Vitals:  Vitals Value Taken Time  BP    Temp    Pulse    Resp    SpO2      Last Pain:  Vitals:   07/28/20 1201  TempSrc:   PainSc: 0-No pain      Patients Stated Pain Goal: 8 (07/28/20 1042)  Complications: No complications documented.

## 2020-07-29 ENCOUNTER — Other Ambulatory Visit: Payer: Self-pay

## 2020-07-29 LAB — SURGICAL PATHOLOGY

## 2020-07-30 ENCOUNTER — Encounter (HOSPITAL_COMMUNITY): Payer: Self-pay | Admitting: Internal Medicine

## 2020-08-18 DIAGNOSIS — R509 Fever, unspecified: Secondary | ICD-10-CM | POA: Diagnosis not present

## 2020-08-18 DIAGNOSIS — Z20828 Contact with and (suspected) exposure to other viral communicable diseases: Secondary | ICD-10-CM | POA: Diagnosis not present

## 2020-08-24 ENCOUNTER — Encounter: Payer: Self-pay | Admitting: Internal Medicine

## 2020-08-27 DIAGNOSIS — F419 Anxiety disorder, unspecified: Secondary | ICD-10-CM | POA: Diagnosis not present

## 2020-08-27 DIAGNOSIS — Z6841 Body Mass Index (BMI) 40.0 and over, adult: Secondary | ICD-10-CM | POA: Diagnosis not present

## 2020-09-24 DIAGNOSIS — Z6839 Body mass index (BMI) 39.0-39.9, adult: Secondary | ICD-10-CM | POA: Diagnosis not present

## 2020-09-24 DIAGNOSIS — F419 Anxiety disorder, unspecified: Secondary | ICD-10-CM | POA: Diagnosis not present

## 2020-10-07 ENCOUNTER — Encounter: Payer: Self-pay | Admitting: Internal Medicine

## 2020-10-07 ENCOUNTER — Ambulatory Visit (INDEPENDENT_AMBULATORY_CARE_PROVIDER_SITE_OTHER): Payer: BC Managed Care – PPO | Admitting: Internal Medicine

## 2020-10-07 ENCOUNTER — Other Ambulatory Visit: Payer: Self-pay

## 2020-10-07 VITALS — BP 128/81 | HR 65 | Temp 97.8°F | Ht 64.0 in | Wt 230.0 lb

## 2020-10-07 DIAGNOSIS — R109 Unspecified abdominal pain: Secondary | ICD-10-CM

## 2020-10-07 DIAGNOSIS — K219 Gastro-esophageal reflux disease without esophagitis: Secondary | ICD-10-CM | POA: Diagnosis not present

## 2020-10-07 DIAGNOSIS — R12 Heartburn: Secondary | ICD-10-CM

## 2020-10-07 MED ORDER — OMEPRAZOLE 40 MG PO CPDR: 40.0000 mg | DELAYED_RELEASE_CAPSULE | Freq: Two times a day (BID) | ORAL | 11 refills | Status: AC

## 2020-10-07 NOTE — Progress Notes (Signed)
Referring Provider: Lawerance Sabal, PA Primary Care Physician:  Lawerance Sabal, [place] Primary GI:  Dr. Marletta Lor  Chief Complaint  Patient presents with  . Follow-up    2 mo fu EGD/TCS    HPI:   Melanie Noble is a 30 y.o. female who presents to the clinic today for follow-up visit.  Recently underwent EGD and colonoscopy in January for acid reflux, heartburn, abdominal pain, diarrhea.  EGD showed gastritis, biopsies negative for H. pylori.  She has been on omeprazole twice daily since that time and states her symptoms are vastly improved.  Doing well.  Notes that if she eats something spicy or tomato based she will have breakthrough symptoms.  Notes forgetting her nightly dose on occasion.  Colonoscopy was unremarkable with negative biopsies for microscopic colitis.  Past Medical History:  Diagnosis Date  . Cold sore     Past Surgical History:  Procedure Laterality Date  . BIOPSY  07/28/2020   Procedure: BIOPSY;  Surgeon: Lanelle Bal, DO;  Location: [facility];  Service: Endoscopy;;  . COLONOSCOPY WITH PROPOFOL N/A 07/28/2020   Procedure: COLONOSCOPY WITH PROPOFOL;  Surgeon: Lanelle Bal, DO;  Location: [facility];  Service: Endoscopy;  Laterality: N/A;  12:00pm  . ESOPHAGOGASTRODUODENOSCOPY (EGD) WITH PROPOFOL N/A 07/28/2020   Procedure: ESOPHAGOGASTRODUODENOSCOPY (EGD) WITH PROPOFOL;  Surgeon: Lanelle Bal, DO;  Location: [facility];  Service: Endoscopy;  Laterality: N/A;  . none      Current Outpatient Medications  Medication Sig Dispense Refill  . acetaminophen (TYLENOL) 500 MG tablet Take 1,000 mg by mouth as needed for moderate pain or mild pain.    Marland Kitchen acyclovir (ZOVIRAX) 200 MG capsule Take 400 mg by mouth 2 (two) times daily.    Marland Kitchen FLUoxetine (PROZAC) 20 MG capsule Take 1 capsule by mouth daily.    . Ibuprofen 200 MG CAPS Take 200 mg by mouth daily as needed (Headache).    Marland Kitchen levonorgestrel (LILETTA) 19.5 MCG/DAY IUD IUD 1 Intra Uterine Device by  Intrauterine route once.    . loratadine (CLARITIN) 10 MG tablet Take 10 mg by mouth daily.    . valACYclovir (VALTREX) 1000 MG tablet 2 tabs bid, take with food (Patient taking differently: as needed. 2 tabs bid, take with food) 4 tablet 0  . omeprazole (PRILOSEC) 40 MG capsule Take 1 capsule (40 mg total) by mouth 2 (two) times daily before a meal. 60 capsule 11   No current facility-administered medications for this visit.    Allergies as of 10/07/2020  . (No Known Allergies)    Family History  Problem Relation Age of Onset  . Thyroid disease Mother   . Heart disease Father   . Colon cancer Maternal Grandfather     Social History   Socioeconomic History  . Marital status: Single    Spouse name: Not on file  . Number of children: Not on file  . Years of education: Not on file  . Highest education level: Not on file  Occupational History  . Not on file  Tobacco Use  . Smoking status: Never Smoker  . Smokeless tobacco: Never Used  Vaping Use  . Vaping Use: Never used  Substance and Sexual Activity  . Alcohol use: Not Currently  . Drug use: No  . Sexual activity: Not on file  Other Topics Concern  . Not on file  Social History Narrative  . Not on file   Social Determinants of Health   Financial  Resource Strain: Not on file  Food Insecurity: Not on file  Transportation Needs: Not on file  Physical Activity: Not on file  Stress: Not on file  Social Connections: Not on file    Subjective: Review of Systems  Constitutional: Negative for chills and fever.  HENT: Negative for congestion and hearing loss.   Eyes: Negative for blurred vision and double vision.  Respiratory: Negative for cough and shortness of breath.   Cardiovascular: Negative for chest pain and palpitations.  Gastrointestinal: Positive for heartburn. Negative for abdominal pain, blood in stool, constipation, diarrhea, melena and vomiting.  Genitourinary: Negative for dysuria and urgency.   Musculoskeletal: Negative for joint pain and myalgias.  Skin: Negative for itching and rash.  Neurological: Negative for dizziness and headaches.  Psychiatric/Behavioral: Negative for depression. The patient is not nervous/anxious.      Objective: BP 128/81   Pulse 65   Temp 97.8 F (36.6 C) (Temporal)   Ht 5\' 4"  (1.626 m)   Wt 230 lb (104.3 kg)   BMI 39.48 kg/m  Physical Exam Constitutional:      Appearance: Normal appearance.  HENT:     Head: Normocephalic and atraumatic.  Eyes:     Extraocular Movements: Extraocular movements intact.     Conjunctiva/sclera: Conjunctivae normal.  Cardiovascular:     Rate and Rhythm: Normal rate and regular rhythm.  Pulmonary:     Effort: Pulmonary effort is normal.     Breath sounds: Normal breath sounds.  Abdominal:     General: Bowel sounds are normal.     Palpations: Abdomen is soft.  Musculoskeletal:        General: No swelling. Normal range of motion.     Cervical back: Normal range of motion and neck supple.  Skin:    General: Skin is warm and dry.     Coloration: Skin is not jaundiced.  Neurological:     General: No focal deficit present.     Mental Status: She is alert and oriented to person, place, and time.  Psychiatric:        Mood and Affect: Mood normal.        Behavior: Behavior normal.      Assessment: *Chronic GERD-well-controlled on omeprazole twice daily *Abdominal pain-improved  Plan: Patient symptoms are vastly improved on omeprazole twice daily.  We will decrease down to once daily and see how she does.  Pain has resolved.  Can consider right upper quadrant ultrasound to rule out biliary colic if patient's symptoms return.  Patient to follow-up in 6 months or sooner if needed  10/07/2020 1:34 PM   Disclaimer: This note was dictated with voice recognition software. Similar sounding words can inadvertently be transcribed and may not be corrected upon review.

## 2020-10-07 NOTE — Patient Instructions (Signed)
I am happy to hear you are doing better.  I would decrease your omeprazole to once daily 30 minutes before breakfast and see how you do.  If you know you are going to be eating spicy foods, tomato-based foods, or other foods that may irritate your chronic reflux at night, I would take another dose 30 minutes before dinner.  Follow-up in 6 months or sooner if needed.  At [facility] we value your feedback. You may receive a survey about your visit today. Please share your experience as we strive to create trusting relationships with our patients to provide genuine, compassionate, quality care.  We appreciate your understanding and patience as we review any laboratory studies, imaging, and other diagnostic tests that are ordered as we care for you. Our office policy is 5 business days for review of these results, and any emergent or urgent results are addressed in a timely manner for your best interest. If you do not hear from our office in 1 week, please contact us.   We also encourage the use of MyChart, which contains your medical information for your review as well. If you are not enrolled in this feature, an access code is on this after visit summary for your convenience. Thank you for allowing [place] to be involved in your care.  It was great to see you today!  I hope you have a great rest of your spring!!    Hennie Duos. Marletta Lor, D.O. Gastroenterology and Hepatology [facility]

## 2021-03-15 ENCOUNTER — Encounter: Payer: Self-pay | Admitting: Internal Medicine

## 2023-08-09 LAB — PANORAMA PRENATAL TEST FULL PANEL:PANORAMA TEST PLUS 5 ADDITIONAL MICRODELETIONS: FETAL FRACTION: 5.2

## 2024-07-24 ENCOUNTER — Encounter (INDEPENDENT_AMBULATORY_CARE_PROVIDER_SITE_OTHER): Payer: Self-pay | Admitting: *Deleted

## 2024-08-27 ENCOUNTER — Ambulatory Visit (INDEPENDENT_AMBULATORY_CARE_PROVIDER_SITE_OTHER): Admitting: Gastroenterology
